# Patient Record
Sex: Male | Born: 1995 | Race: Black or African American | Hispanic: No | Marital: Single | Smoking: Never smoker
Health system: Southern US, Community
[De-identification: ages and names within clinical notes are randomized; demographics above are authoritative.]

---

## 2016-02-18 ENCOUNTER — Encounter (HOSPITAL_COMMUNITY): Payer: Self-pay | Admitting: *Deleted

## 2016-02-18 ENCOUNTER — Emergency Department (HOSPITAL_COMMUNITY)
Admission: EM | Admit: 2016-02-18 | Discharge: 2016-02-18 | Disposition: A | Discharge status: Home / Self Care | Payer: BLUE CROSS/BLUE SHIELD | Attending: Emergency Medicine | Admitting: Emergency Medicine

## 2016-02-18 ENCOUNTER — Emergency Department (HOSPITAL_COMMUNITY): Payer: BLUE CROSS/BLUE SHIELD

## 2016-02-18 DIAGNOSIS — R42 Dizziness and giddiness: Secondary | ICD-10-CM | POA: Insufficient documentation

## 2016-02-18 DIAGNOSIS — R05 Cough: Secondary | ICD-10-CM | POA: Diagnosis present

## 2016-02-18 DIAGNOSIS — J40 Bronchitis, not specified as acute or chronic: Secondary | ICD-10-CM | POA: Insufficient documentation

## 2016-02-18 LAB — I-STAT CHEM 8, ED
BUN: 6 mg/dL (ref 6–20)
CALCIUM ION: 1.23 mmol/L (ref 1.12–1.23)
Chloride: 103 mmol/L (ref 101–111)
Creatinine, Ser: 1.2 mg/dL (ref 0.61–1.24)
GLUCOSE: 91 mg/dL (ref 65–99)
HCT: 47 % (ref 39.0–52.0)
Hemoglobin: 16 g/dL (ref 13.0–17.0)
Potassium: 4.4 mmol/L (ref 3.5–5.1)
SODIUM: 143 mmol/L (ref 135–145)
TCO2: 28 mmol/L (ref 0–100)

## 2016-02-18 LAB — CBC
HCT: 44.4 % (ref 39.0–52.0)
Hemoglobin: 14.5 g/dL (ref 13.0–17.0)
MCH: 27.7 pg (ref 26.0–34.0)
MCHC: 32.7 g/dL (ref 30.0–36.0)
MCV: 84.7 fL (ref 78.0–100.0)
PLATELETS: 248 10*3/uL (ref 150–400)
RBC: 5.24 MIL/uL (ref 4.22–5.81)
RDW: 12.4 % (ref 11.5–15.5)
WBC: 5.1 10*3/uL (ref 4.0–10.5)

## 2016-02-18 MED ORDER — IBUPROFEN 800 MG PO TABS
800.0000 mg | ORAL_TABLET | Freq: Once | ORAL | Status: AC
  Administered 2016-02-18: 800 mg via ORAL
  Filled 2016-02-18: qty 1

## 2016-02-18 MED ORDER — PREDNISONE 20 MG PO TABS
60.0000 mg | ORAL_TABLET | Freq: Once | ORAL | Status: AC
  Administered 2016-02-18: 60 mg via ORAL
  Filled 2016-02-18: qty 3

## 2016-02-18 MED ORDER — PREDNISONE 20 MG PO TABS: 60.0000 mg | ORAL_TABLET | Freq: Every day | ORAL | Status: AC

## 2016-02-18 MED ORDER — NAPROXEN 500 MG PO TABS: 500.0000 mg | ORAL_TABLET | Freq: Two times a day (BID) | ORAL | Status: AC

## 2016-02-18 MED ORDER — ALBUTEROL SULFATE HFA 108 (90 BASE) MCG/ACT IN AERS
2.0000 | INHALATION_SPRAY | RESPIRATORY_TRACT | Status: DC | PRN
Start: 2016-02-18 — End: 2016-02-18
  Administered 2016-02-18: 2 via RESPIRATORY_TRACT
  Filled 2016-02-18: qty 6.7

## 2016-02-18 NOTE — ED Provider Notes (Signed)
CSN: [id number]     Arrival date & time 02/18/16  0112 History  By signing my name below, I, Bethel Born, attest that this documentation has been prepared under the direction and in the presence of Gilda Crease, MD. Electronically Signed: Bethel Born, ED Scribe. 02/18/2016. 3:32 AM   Chief Complaint  Patient presents with  . URI    The history is provided by the patient. No language interpreter was used.   Jerry Golden is a 20 y.o. male who presents to the Emergency Department complaining of new, constant, sore,  left sided chest pain with onset 2 weeks ago. Associated symptoms include a non-productive cough that exacerbates the chest pain and lightheadedness. Pt denies fever, sweats, chills, vomiting, and diarrhea. No history of asthma or other lung disease.   History reviewed. No pertinent past medical history. History reviewed. No pertinent past surgical history. No family history on file. Social History  Substance Use Topics  . Smoking status: Never Smoker   . Smokeless tobacco: None  . Alcohol Use: No    Review of Systems  Constitutional: Negative for fever and diaphoresis.  Respiratory: Positive for cough.   Cardiovascular: Positive for chest pain.  Gastrointestinal: Negative for nausea, vomiting and diarrhea.  Neurological: Positive for light-headedness.  All other systems reviewed and are negative.     Allergies  Review of patient's allergies indicates no known allergies.  Home Medications   Prior to Admission medications   Medication Sig Start Date End Date Taking? Authorizing Provider  naproxen (NAPROSYN) 500 MG tablet Take 1 tablet (500 mg total) by mouth 2 (two) times daily. 02/18/16   Gilda Crease, MD  predniSONE (DELTASONE) 20 MG tablet Take 3 tablets (60 mg total) by mouth daily with breakfast. 02/18/16   Gilda Crease, MD   BP 126/83 mmHg  Pulse 66  Temp(Src) 98 F (36.7 C) (Oral)  Resp 20  Wt 130 lb (58.968 kg)  SpO2  100% Physical Exam  Constitutional: He is oriented to person, place, and time. He appears well-developed and well-nourished. No distress.  HENT:  Head: Normocephalic and atraumatic.  Right Ear: Hearing normal.  Left Ear: Hearing normal.  Nose: Nose normal.  Mouth/Throat: Oropharynx is clear and moist and mucous membranes are normal.  Eyes: Conjunctivae and EOM are normal. Pupils are equal, round, and reactive to light.  Neck: Normal range of motion. Neck supple.  Cardiovascular: Regular rhythm, S1 normal and S2 normal.  Exam reveals no gallop and no friction rub.   No murmur heard. Pulmonary/Chest: Effort normal and breath sounds normal. No respiratory distress. He exhibits no tenderness.  Abdominal: Soft. Normal appearance and bowel sounds are normal. There is no hepatosplenomegaly. There is no tenderness. There is no rebound, no guarding, no tenderness at McBurney's point and negative Murphy's sign. No hernia.  Musculoskeletal: Normal range of motion.  Neurological: He is alert and oriented to person, place, and time. He has normal strength. No cranial nerve deficit or sensory deficit. Coordination normal. GCS eye subscore is 4. GCS verbal subscore is 5. GCS motor subscore is 6.  Skin: Skin is warm, dry and intact. No rash noted. No cyanosis.  Psychiatric: He has a normal mood and affect. His speech is normal and behavior is normal. Thought content normal.  Nursing note and vitals reviewed.   ED Course  Procedures (including critical care time) DIAGNOSTIC STUDIES: Oxygen Saturation is 100% on RA,  normal by my interpretation.    COORDINATION OF CARE: 3:30 AM  Discussed treatment plan which includes lab work and CXR with pt at bedside and pt agreed to plan.  Labs Review Labs Reviewed  CBC  I-STAT CHEM 8, ED    Imaging Review Dg Chest 2 View  02/18/2016  CLINICAL DATA:  20 year old male with shortness of breath and chest pain EXAM: CHEST  2 VIEW COMPARISON:  None. FINDINGS: The  heart size and mediastinal contours are within normal limits. Both lungs are clear. The visualized skeletal structures are unremarkable. IMPRESSION: No active cardiopulmonary disease. Electronically Signed   By: Elgie CollardArash  Radparvar M.D.   On: 02/18/2016 02:29   I have personally reviewed and evaluated these images and lab results as part of my medical decision-making.   EKG Interpretation None      MDM   Final diagnoses:  Bronchitis   PAtient presents to the ER for evaluation of cough and chest congestion has been ongoing for 2 weeks. Patient has developed a sharp pain in the left chest that happens whenever he coughs. Examination reveals tenderness in the area. Pain is reproducible with palpation as well as movement. Chest x-ray does not show any evidence of pneumonia. Patient treated empirically for bronchitis.  I personally performed the services described in this documentation, which was scribed in my presence. The recorded information has been reviewed and is accurate.     Gilda Creasehristopher J Pollina, MD 02/18/16 [phone]

## 2016-02-18 NOTE — ED Notes (Signed)
Pt has had a URI for 2 weeks and reports that his chest is sore, pain with movement and coughing.  No sob and no fever with this.  Cough non productive

## 2016-02-18 NOTE — Discharge Instructions (Signed)

## 2017-05-02 IMAGING — DX DG CHEST 2V
2 series · 2 of 2 positions shown · non-contrast
Comparison: None.

CLINICAL DATA: 20-year-old male with shortness of breath and chest
pain

EXAM:
CHEST  2 VIEW

[chest pa]
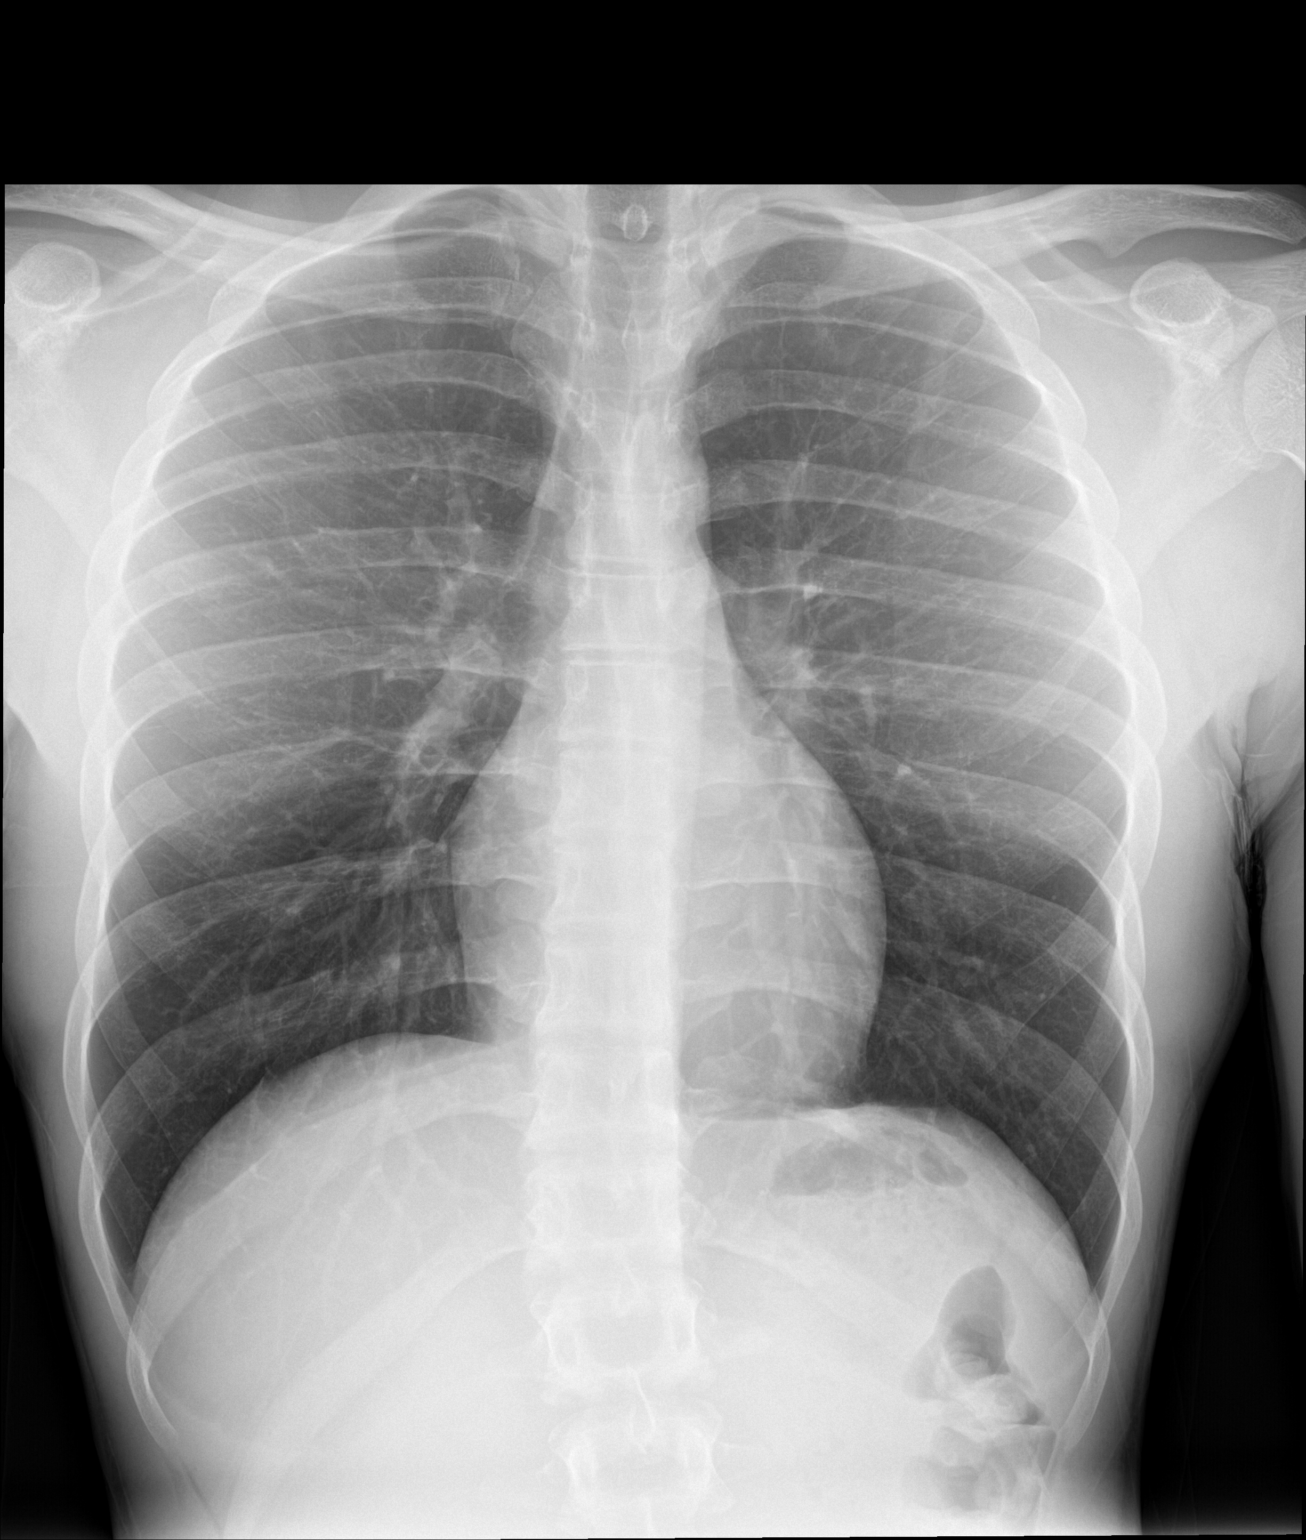

[chest lat]
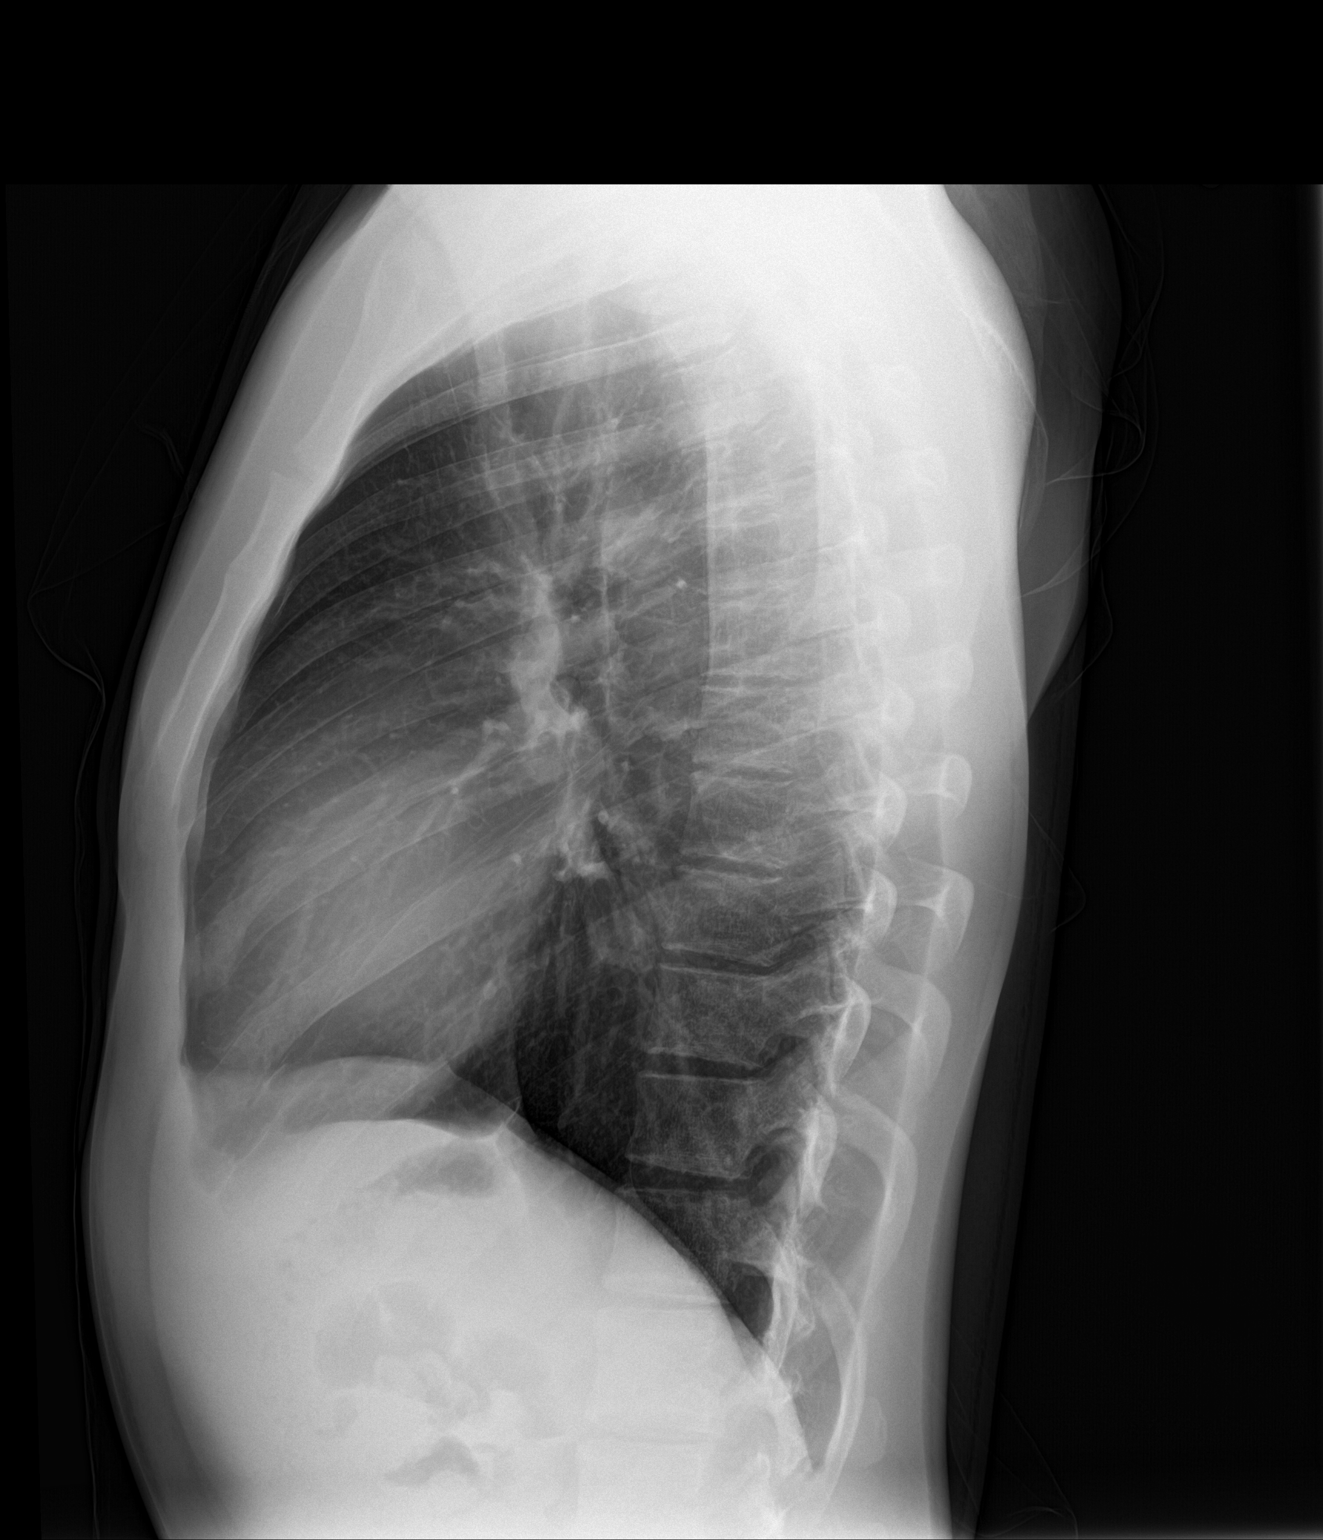

[2 of 2 positions shown; findings below may reference images not displayed]

FINDINGS: The heart size and mediastinal contours are within normal limits.
Both lungs are clear. The visualized skeletal structures are
unremarkable.
IMPRESSION: No active cardiopulmonary disease.
